# Patient Record
Sex: Male | Born: 1965 | Race: Black or African American | Hispanic: No | Marital: Married | Smoking: Never smoker
Health system: Southern US, Community
[De-identification: ages and names within clinical notes are randomized; demographics above are authoritative.]

## PROBLEM LIST (undated history)

## (undated) DIAGNOSIS — F419 Anxiety disorder, unspecified: Secondary | ICD-10-CM

## (undated) HISTORY — DX: Anxiety disorder, unspecified: F41.9

---

## 2013-12-07 ENCOUNTER — Ambulatory Visit (INDEPENDENT_AMBULATORY_CARE_PROVIDER_SITE_OTHER): Payer: 59 | Admitting: Emergency Medicine

## 2013-12-07 VITALS — BP 138/86 | HR 71 | Temp 98.2°F | Resp 16 | Ht 67.0 in | Wt 264.0 lb

## 2013-12-07 DIAGNOSIS — Z0000 Encounter for general adult medical examination without abnormal findings: Secondary | ICD-10-CM

## 2013-12-07 DIAGNOSIS — E669 Obesity, unspecified: Secondary | ICD-10-CM

## 2013-12-07 DIAGNOSIS — R824 Acetonuria: Secondary | ICD-10-CM

## 2013-12-07 LAB — GLUCOSE, POCT (MANUAL RESULT ENTRY): POC Glucose: 87 mg/dl (ref 70–99)

## 2013-12-07 LAB — POCT UA - MICROSCOPIC ONLY
BACTERIA, U MICROSCOPIC: NEGATIVE
Casts, Ur, LPF, POC: NEGATIVE
Crystals, Ur, HPF, POC: NEGATIVE
MUCUS UA: NEGATIVE
Yeast, UA: NEGATIVE

## 2013-12-07 LAB — POCT URINALYSIS DIPSTICK
BILIRUBIN UA: NEGATIVE
GLUCOSE UA: NEGATIVE
KETONES UA: 15
Leukocytes, UA: NEGATIVE
Nitrite, UA: NEGATIVE
Protein, UA: NEGATIVE
Spec Grav, UA: >=1.03
Urobilinogen, UA: 0.2
pH, UA: 5.5

## 2013-12-07 NOTE — Progress Notes (Addendum)
Subjective:    Patient ID: Benjamin Phelps, male    DOB: [birth year], 48 y.o.   MRN: [id number] This chart was scribed for [name], MD by Jolene Provostobert Halas, Medical Scribe. This patient was seen in Room 3 and the patient's care was started at 2:14 PM.  HPI HPI Comments: Benjamin ColumbiaReynaldo Hollinsworth is a 48 y.o. male who [facility] reporting for a annual physical. Pt states he recently moved from [place]. Pt has a family hx of DM, father, mother and grandmother. Pt's grandfather died of colon cancer in his 480's. Pt states he has had mild recurrent back pain, that he treats with ibuprofen and stretching. Pt states he does not want a flu shot. Pt states he got a tetanus shot last year. Pt states he got blood text this morning at a lab because of his new job. Pt states he will have his [facility]. Pt states he has no major medical problems, and does not take any medication. Pt has NKDA. Pt does not smoke. Pt does not drink.     Review of Systems  Constitutional: Negative for fever, chills and appetite change.  HENT: Negative for rhinorrhea.   Gastrointestinal: Positive for nausea. Negative for vomiting.  Musculoskeletal: Positive for back pain.  Skin: Negative for color change.  Psychiatric/Behavioral: Negative for agitation.       Objective:   Physical Exam  Nursing note and vitals reviewed. Constitutional: He is oriented to person, place, and time. He appears well-developed and well-nourished.  Obese.   HENT:  Head: Normocephalic and atraumatic.  Eyes: Pupils are equal, round, and reactive to light.  Neck: No JVD present.  Cardiovascular: Normal rate, regular rhythm and normal heart sounds.   Pulmonary/Chest: Effort normal and breath sounds normal. No respiratory distress.  Abdominal: Soft. Bowel sounds are normal. There is no tenderness.  Genitourinary: Penis normal.  Prostate normal, no masses detected.  Musculoskeletal: Normal range of motion. He exhibits no edema.    Neurological: He is alert and oriented to person, place, and time.  Skin: Skin is warm and dry.  Psychiatric: He has a normal mood and affect. His behavior is normal.   Results for orders placed in visit on 12/07/13  POCT URINALYSIS DIPSTICK      Result Value Ref Range   Color, UA Yellow     Clarity, UA Clear     Glucose, UA Neg     Bilirubin, UA Neg     Ketones, UA 15     Spec Grav, UA >=1.030     Blood, UA small     pH, UA 5.5     Protein, UA Neg     Urobilinogen, UA 0.2     Nitrite, UA Neg     Leukocytes, UA Negative    POCT UA - MICROSCOPIC ONLY      Result Value Ref Range   WBC, Ur, HPF, POC 0-1     RBC, urine, microscopic 0-1     Bacteria, U Microscopic neg     Mucus, UA neg     Epithelial cells, urine per micros 0-1     Crystals, Ur, HPF, POC neg     Casts, Ur, LPF, POC neg     Yeast, UA neg    GLUCOSE, POCT (MANUAL RESULT ENTRY)      Result Value Ref Range   POC Glucose 87  70 - 99 mg/dl       Assessment & Plan:  Patient did  have ketones in his urine with high specific gravity prospectus or not eating and fasting today. He had insignificant hematuria. Will await the results of his other testing. He had biometric testing this morning at work and he will bring these results with him. He does not want a flu shot today. He states he is up-to-date on his tetanus immunization

## 2013-12-07 NOTE — Patient Instructions (Signed)

## 2013-12-09 LAB — IFOBT (OCCULT BLOOD): IFOBT: NEGATIVE

## 2015-05-20 ENCOUNTER — Ambulatory Visit (INDEPENDENT_AMBULATORY_CARE_PROVIDER_SITE_OTHER): Payer: 59 | Admitting: Family Medicine

## 2015-05-20 ENCOUNTER — Ambulatory Visit (INDEPENDENT_AMBULATORY_CARE_PROVIDER_SITE_OTHER): Payer: 59

## 2015-05-20 VITALS — BP 122/72 | HR 78 | Temp 98.0°F | Resp 17 | Ht 66.5 in | Wt 267.0 lb

## 2015-05-20 DIAGNOSIS — K219 Gastro-esophageal reflux disease without esophagitis: Secondary | ICD-10-CM | POA: Diagnosis not present

## 2015-05-20 DIAGNOSIS — R079 Chest pain, unspecified: Secondary | ICD-10-CM | POA: Diagnosis not present

## 2015-05-20 DIAGNOSIS — R0789 Other chest pain: Secondary | ICD-10-CM | POA: Diagnosis not present

## 2015-05-20 MED ORDER — MELOXICAM 7.5 MG PO TABS: 7.5000 mg | ORAL_TABLET | Freq: Every day | ORAL | Status: AC

## 2015-05-20 MED ORDER — ESOMEPRAZOLE MAGNESIUM 20 MG PO PACK: 20.0000 mg | PACK | Freq: Every day | ORAL | Status: AC

## 2015-05-20 NOTE — Progress Notes (Signed)
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 Began gradually yesterday after he had a big meal and went to lie down. He's had no shortness of breath.    patient has had some intermittent sweats at night for several weeks.  Patient has no history of diabetes, smoking, alcohol abuse, hypertension, or elevated cholesterol.  Patient's had no nausea, vomiting, epigastric pain or blood in stools.  Objective: Patient is alert, cooperative and pleasant. He is interviewed in the presence of his wife.  HEENT: Unremarkable with normal sclera and EOM. Oropharynx is clear Neck: Supple no adenopathy or thyromegaly Chest: Clear to auscultation, tender in the right pectoral muscle Heart: Regular without murmur, physiologically split S2 Abdomen: Soft nontender without HSM. He is obese and I am unable to palpate any masses. Skin: No rash  EKG: Normal sinus rhythm without any acute changes Chest: Some peribronchial thickening, otherwise no cardiomegaly or infiltrates  Assessment: I think patient has some muscle chest wall pain and reflux.  Chest pain, unspecified chest pain type - Plan: DG Chest 2 View, EKG 12-Lead  Gastroesophageal reflux disease without esophagitis - Plan: esomeprazole (NEXIUM) 20 MG packet  Chest wall pain - Plan: meloxicam (MOBIC) 7.5 MG tablet  Elvina SidleKurt Alvenia Treese, Md

## 2015-05-20 NOTE — Patient Instructions (Addendum)
The x-ray and EKG appear to be normal. I am sending the x-ray to the radiologist and he will over read the film in the next 24 hours.  I believe you have some reflux as well as some muscle strain in the right chest. I'm giving her samples of Nexium to take once a day and I've called in some meloxicam to help with the muscle strain.  Neither of these medicines cause drowsiness and you should see improvement in the next 48 hours.

## 2015-06-15 ENCOUNTER — Other Ambulatory Visit: Payer: Self-pay | Admitting: Family Medicine

## 2016-06-18 ENCOUNTER — Encounter: Payer: Self-pay | Admitting: Emergency Medicine

## 2016-06-18 ENCOUNTER — Ambulatory Visit (INDEPENDENT_AMBULATORY_CARE_PROVIDER_SITE_OTHER): Payer: 59 | Admitting: Emergency Medicine

## 2016-06-18 VITALS — BP 117/68 | HR 61 | Temp 97.9°F | Resp 18 | Ht 66.5 in | Wt 222.6 lb

## 2016-06-18 DIAGNOSIS — M545 Low back pain, unspecified: Secondary | ICD-10-CM | POA: Insufficient documentation

## 2016-06-18 DIAGNOSIS — M5136 Other intervertebral disc degeneration, lumbar region: Secondary | ICD-10-CM | POA: Diagnosis not present

## 2016-06-18 DIAGNOSIS — M5432 Sciatica, left side: Secondary | ICD-10-CM | POA: Diagnosis not present

## 2016-06-18 MED ORDER — CYCLOBENZAPRINE HCL 10 MG PO TABS: 10.0000 mg | ORAL_TABLET | Freq: Three times a day (TID) | ORAL | 0 refills | Status: AC | PRN

## 2016-06-18 MED ORDER — DICLOFENAC SODIUM 75 MG PO TBEC: 75.0000 mg | DELAYED_RELEASE_TABLET | Freq: Two times a day (BID) | ORAL | 0 refills | Status: AC

## 2016-06-18 NOTE — Progress Notes (Signed)
Jeryl Columbia 51 y.o.   Chief Complaint  Patient presents with  . Leg Pain    left thigh pain x 3 weeks     HISTORY OF PRESENT ILLNESS: This is a 51 y.o. male complaining of throbbing pain to left upper leg x 3 weeks; feels like sciatica pain; has h/o lumbar disc disease; MRI 4 years ago; denies recent trauma or any other significant symptoms.   HPI   Prior to Admission medications   Medication Sig Start Date End Date Taking? Authorizing Provider  Ascorbic Acid (VITAMIN C) 100 MG tablet Take 100 mg by mouth daily.   Yes Historical Provider, MD  aspirin 81 MG tablet Take 81 mg by mouth daily.   Yes Historical Provider, MD  Multiple Vitamins-Minerals (MULTIVITAMIN WITH MINERALS) tablet Take 1 tablet by mouth daily.   Yes Historical Provider, MD  esomeprazole (NEXIUM) 20 MG packet Take 20 mg by mouth daily before breakfast. Patient not taking: Reported on 06/18/2016 05/20/15   Elvina Sidle, MD  meloxicam (MOBIC) 7.5 MG tablet Take 1 tablet (7.5 mg total) by mouth daily. Patient not taking: Reported on 06/18/2016 05/20/15   Elvina Sidle, MD    No Known Allergies  Patient Active Problem List   Diagnosis Date Noted  . Obesity 12/07/2013    Past Medical History:  Diagnosis Date  . Anxiety     No past surgical history on file.  Social History   Social History  . Marital status: Married    Spouse name: N/A  . Number of children: N/A  . Years of education: N/A   Occupational History  . Not on file.   Social History Main Topics  . Smoking status: Never Smoker  . Smokeless tobacco: Never Used  . Alcohol use No  . Drug use: No  . Sexual activity: No   Other Topics Concern  . Not on file   Social History Narrative  . No narrative on file    Family History  Problem Relation Age of Onset  . Diabetes Mother   . Hypertension Mother   . Cancer Father   . Hypertension Father   . Diabetes Father   . Diabetes Maternal Grandmother   . Diabetes Maternal Grandfather     . Cancer Maternal Grandfather      Review of Systems  Constitutional: Negative.  Negative for chills and fever.  HENT: Negative.   Eyes: Negative.   Respiratory: Negative.  Negative for shortness of breath.   Cardiovascular: Negative.  Negative for chest pain and palpitations.  Gastrointestinal: Negative for abdominal pain, diarrhea, nausea and vomiting.  Musculoskeletal: Positive for back pain.  Skin: Negative.  Negative for rash.  Neurological: Negative.  Negative for dizziness, sensory change, focal weakness and headaches.  All other systems reviewed and are negative.  Vitals:   06/18/16 1339  BP: 117/68  Pulse: 61  Resp: 18  Temp: 97.9 F (36.6 C)     Physical Exam  Constitutional: He is oriented to person, place, and time. He appears well-developed and well-nourished.  HENT:  Head: Normocephalic and atraumatic.  Eyes: EOM are normal. Pupils are equal, round, and reactive to light.  Neck: Normal range of motion. Neck supple.  Cardiovascular: Normal rate.   Pulmonary/Chest: Effort normal and breath sounds normal.  Abdominal: Soft. Bowel sounds are normal. He exhibits no distension. There is no tenderness.  Musculoskeletal:       Lumbar back: He exhibits decreased range of motion, tenderness and spasm. He exhibits normal pulse.  Back:  LE: +varicose veins  Neurological: He is alert and oriented to person, place, and time. No sensory deficit. He exhibits normal muscle tone.  Reflex Scores:      Patellar reflexes are 3+ on the right side and 1+ on the left side.      Achilles reflexes are 2+ on the right side and 2+ on the left side. Skin: Skin is warm and dry. Capillary refill takes less than 2 seconds.  Psychiatric: He has a normal mood and affect. His behavior is normal.  Vitals reviewed.    ASSESSMENT & PLAN: Donnetta SimpersReynaldo was seen today for leg pain.  Diagnoses and all orders for this visit:  Lumbar pain -     Ambulatory referral to Orthopedic  Surgery  Sciatica of left side -     Ambulatory referral to Orthopedic Surgery  DDD (degenerative disc disease), lumbar -     Ambulatory referral to Orthopedic Surgery  Other orders -     diclofenac (VOLTAREN) 75 MG EC tablet; Take 1 tablet (75 mg total) by mouth 2 (two) times daily. -     cyclobenzaprine (FLEXERIL) 10 MG tablet; Take 1 tablet (10 mg total) by mouth 3 (three) times daily as needed for muscle spasms.    Patient Instructions       IF you received an x-ray today, you will receive an invoice from [facility]. Please contact [facility] at [phone] with questions or concerns regarding your invoice.   IF you received labwork today, you will receive an invoice from [facility]. Please contact [facility] at [phone] with questions or concerns regarding your invoice.   Our billing staff will not be able to assist you with questions regarding bills from these companies.  You will be contacted with the lab results as soon as they are available. The fastest way to get your results is to activate your My Chart account. Instructions are located on the last page of this paperwork. If you have not heard from us regarding the results in 2 weeks, please contact this office.      Sciatica Sciatica is pain, numbness, weakness, or tingling along your sciatic nerve. The sciatic nerve starts in the lower back and goes down the back of each leg. Sciatica happens when this nerve is pinched or has pressure put on it. Sciatica usually goes away on its own or with treatment. Sometimes, sciatica may keep coming back (recur). Follow these instructions at home: Medicines   Take over-the-counter and prescription medicines only as told by your doctor.  Do not drive or use heavy machinery while taking prescription pain medicine. Managing pain   If directed, put ice on the affected area.  Put ice in a plastic bag.  Place a towel between your skin and the bag.  Leave  the ice on for 20 minutes, 2-3 times a day.  After icing, apply heat to the affected area before you exercise or as often as told by your doctor. Use the heat source that your doctor tells you to use, such as a moist heat pack or a heating pad.  Place a towel between your skin and the heat source.  Leave the heat on for 20-30 minutes.  Remove the heat if your skin turns bright red. This is especially important if you are unable to feel pain, heat, or cold. You may have a greater risk of getting burned. Activity   Return to your normal activities as told by your doctor. Ask your doctor what  activities are safe for you.  Avoid activities that make your sciatica worse.  Take short rests during the day. Rest in a lying or standing position. This is usually better than sitting to rest.  When you rest for a long time, do some physical activity or stretching between periods of rest.  Avoid sitting for a long time without moving. Get up and move around at least one time each hour.  Exercise and stretch regularly, as told by your doctor.  Do not lift anything that is heavier than 10 lb (4.5 kg) while you have symptoms of sciatica.  Avoid lifting heavy things even when you do not have symptoms.  Avoid lifting heavy things over and over.  When you lift objects, always lift in a way that is safe for your body. To do this, you should:  Bend your knees.  Keep the object close to your body.  Avoid twisting. General instructions   Use good posture.  Avoid leaning forward when you are sitting.  Avoid hunching over when you are standing.  Stay at a healthy weight.  Wear comfortable shoes that support your feet. Avoid wearing high heels.  Avoid sleeping on a mattress that is too soft or too hard. You might have less pain if you sleep on a mattress that is firm enough to support your back.  Keep all follow-up visits as told by your doctor. This is important. Contact a doctor if:  You  have pain that:  Wakes you up when you are sleeping.  Gets worse when you lie down.  Is worse than the pain you have had in the past.  Lasts longer than 4 weeks.  You lose weight for without trying. Get help right away if:  You cannot control when you pee (urinate) or poop (have a bowel movement).  You have weakness in any of these areas and it gets worse.  Lower back.  Lower belly (pelvis).  Butt (buttocks).  Legs.  You have redness or swelling of your back.  You have a burning feeling when you pee. This information is not intended to replace advice given to you by your health care provider. Make sure you discuss any questions you have with your health care provider. Document Released: 11/12/2007 Document Revised: 07/11/2015 Document Reviewed: 10/12/2014 Elsevier Interactive Patient Education  2017 Elsevier Inc.      Edwina Barth, MD [facility]

## 2016-06-18 NOTE — Patient Instructions (Addendum)
   IF you received an x-ray today, you will receive an invoice from [facility]. Please contact  Radiology at [phone] with questions or concerns regarding your invoice.   IF you received labwork today, you will receive an invoice from [facility]. Please contact [facility] at [phone] with questions or concerns regarding your invoice.   Our billing staff will not be able to assist you with questions regarding bills from these companies.  You will be contacted with the lab results as soon as they are available. The fastest way to get your results is to activate your My Chart account. Instructions are located on the last page of this paperwork. If you have not heard from us regarding the results in 2 weeks, please contact this office.     Sciatica Sciatica is pain, numbness, weakness, or tingling along your sciatic nerve. The sciatic nerve starts in the lower back and goes down the back of each leg. Sciatica happens when this nerve is pinched or has pressure put on it. Sciatica usually goes away on its own or with treatment. Sometimes, sciatica may keep coming back (recur). Follow these instructions at home: Medicines   Take over-the-counter and prescription medicines only as told by your doctor.  Do not drive or use heavy machinery while taking prescription pain medicine. Managing pain   If directed, put ice on the affected area.  Put ice in a plastic bag.  Place a towel between your skin and the bag.  Leave the ice on for 20 minutes, 2-3 times a day.  After icing, apply heat to the affected area before you exercise or as often as told by your doctor. Use the heat source that your doctor tells you to use, such as a moist heat pack or a heating pad.  Place a towel between your skin and the heat source.  Leave the heat on for 20-30 minutes.  Remove the heat if your skin turns bright red. This is especially important if you are unable to feel pain, heat, or  cold. You may have a greater risk of getting burned. Activity   Return to your normal activities as told by your doctor. Ask your doctor what activities are safe for you.  Avoid activities that make your sciatica worse.  Take short rests during the day. Rest in a lying or standing position. This is usually better than sitting to rest.  When you rest for a long time, do some physical activity or stretching between periods of rest.  Avoid sitting for a long time without moving. Get up and move around at least one time each hour.  Exercise and stretch regularly, as told by your doctor.  Do not lift anything that is heavier than 10 lb (4.5 kg) while you have symptoms of sciatica.  Avoid lifting heavy things even when you do not have symptoms.  Avoid lifting heavy things over and over.  When you lift objects, always lift in a way that is safe for your body. To do this, you should:  Bend your knees.  Keep the object close to your body.  Avoid twisting. General instructions   Use good posture.  Avoid leaning forward when you are sitting.  Avoid hunching over when you are standing.  Stay at a healthy weight.  Wear comfortable shoes that support your feet. Avoid wearing high heels.  Avoid sleeping on a mattress that is too soft or too hard. You might have less pain if you sleep on a mattress   that is firm enough to support your back.  Keep all follow-up visits as told by your doctor. This is important. Contact a doctor if:  You have pain that:  Wakes you up when you are sleeping.  Gets worse when you lie down.  Is worse than the pain you have had in the past.  Lasts longer than 4 weeks.  You lose weight for without trying. Get help right away if:  You cannot control when you pee (urinate) or poop (have a bowel movement).  You have weakness in any of these areas and it gets worse.  Lower back.  Lower belly (pelvis).  Butt (buttocks).  Legs.  You have redness  or swelling of your back.  You have a burning feeling when you pee. This information is not intended to replace advice given to you by your health care provider. Make sure you discuss any questions you have with your health care provider. Document Released: 11/12/2007 Document Revised: 07/11/2015 Document Reviewed: 10/12/2014 Elsevier Interactive Patient Education  2017 Elsevier Inc.  

## 2017-08-22 IMAGING — CR DG CHEST 2V
2 series · 2 of 2 positions shown · non-contrast
Comparison: None.

CLINICAL DATA: Right side chest pain

EXAM:
CHEST  2 VIEW

[PA]
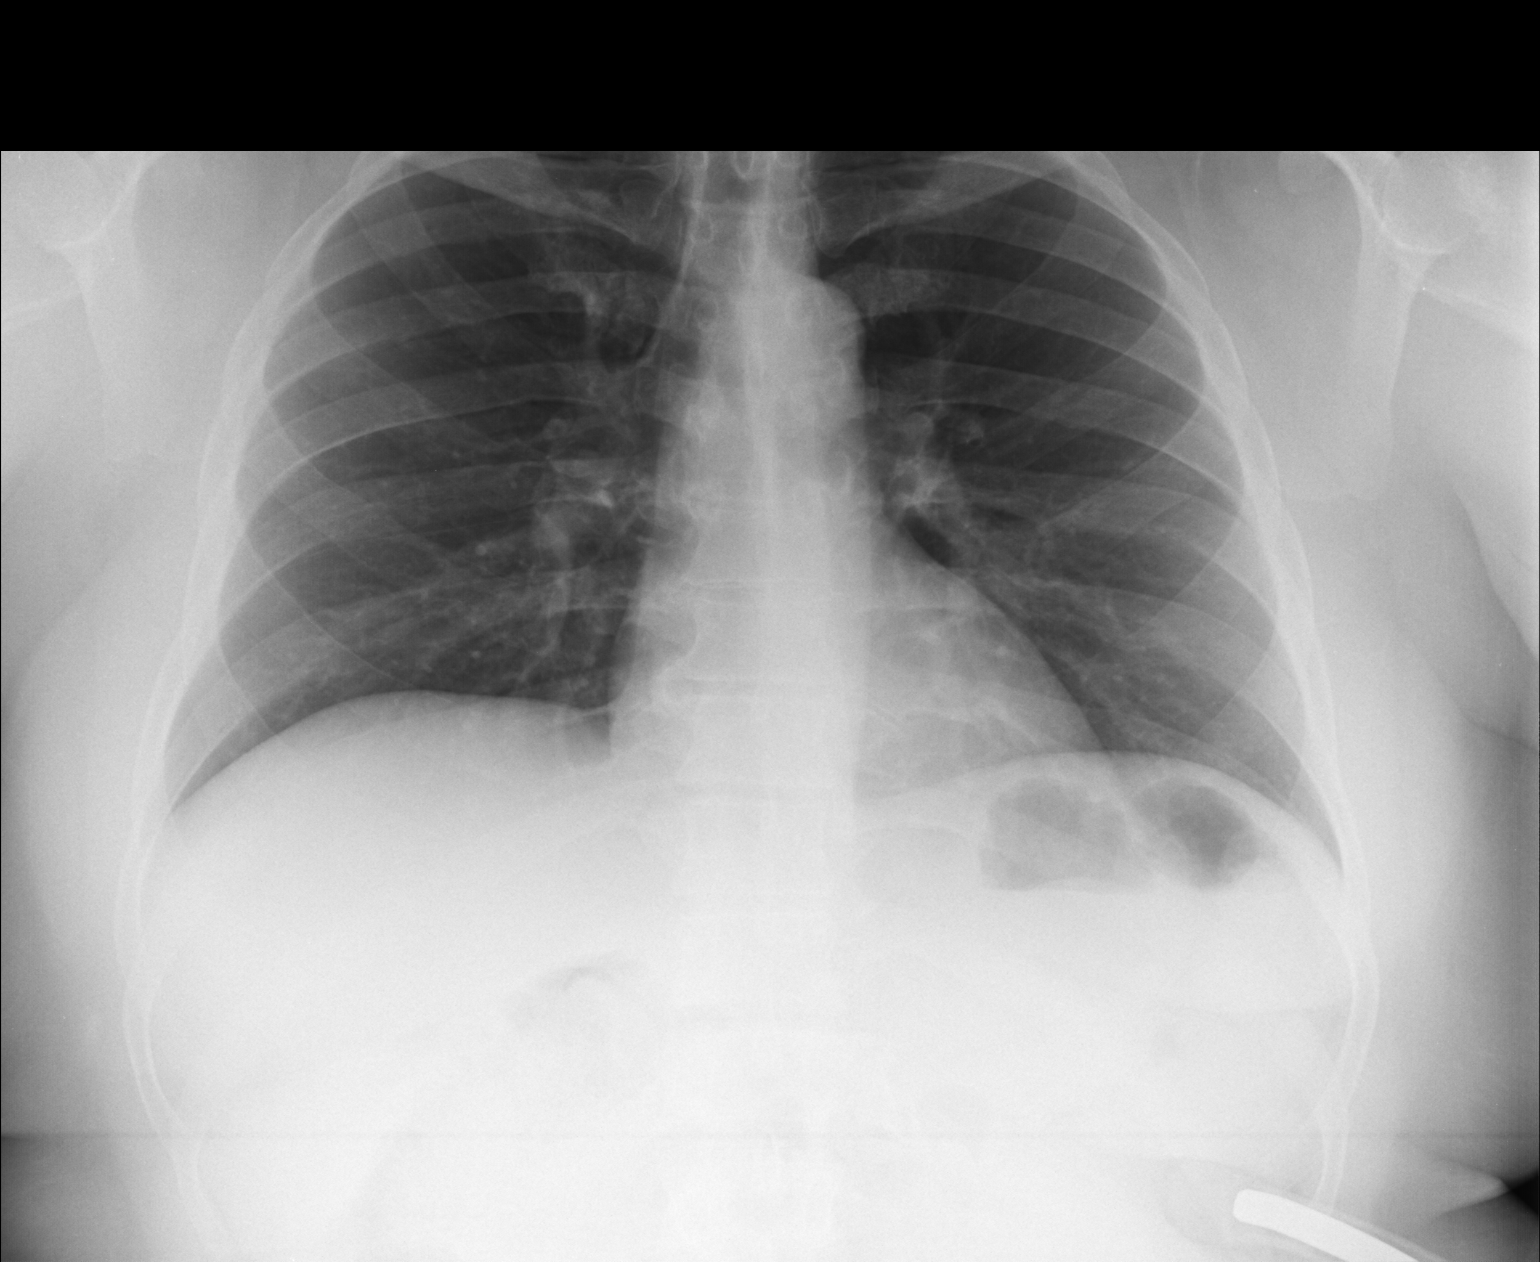

[lateral]
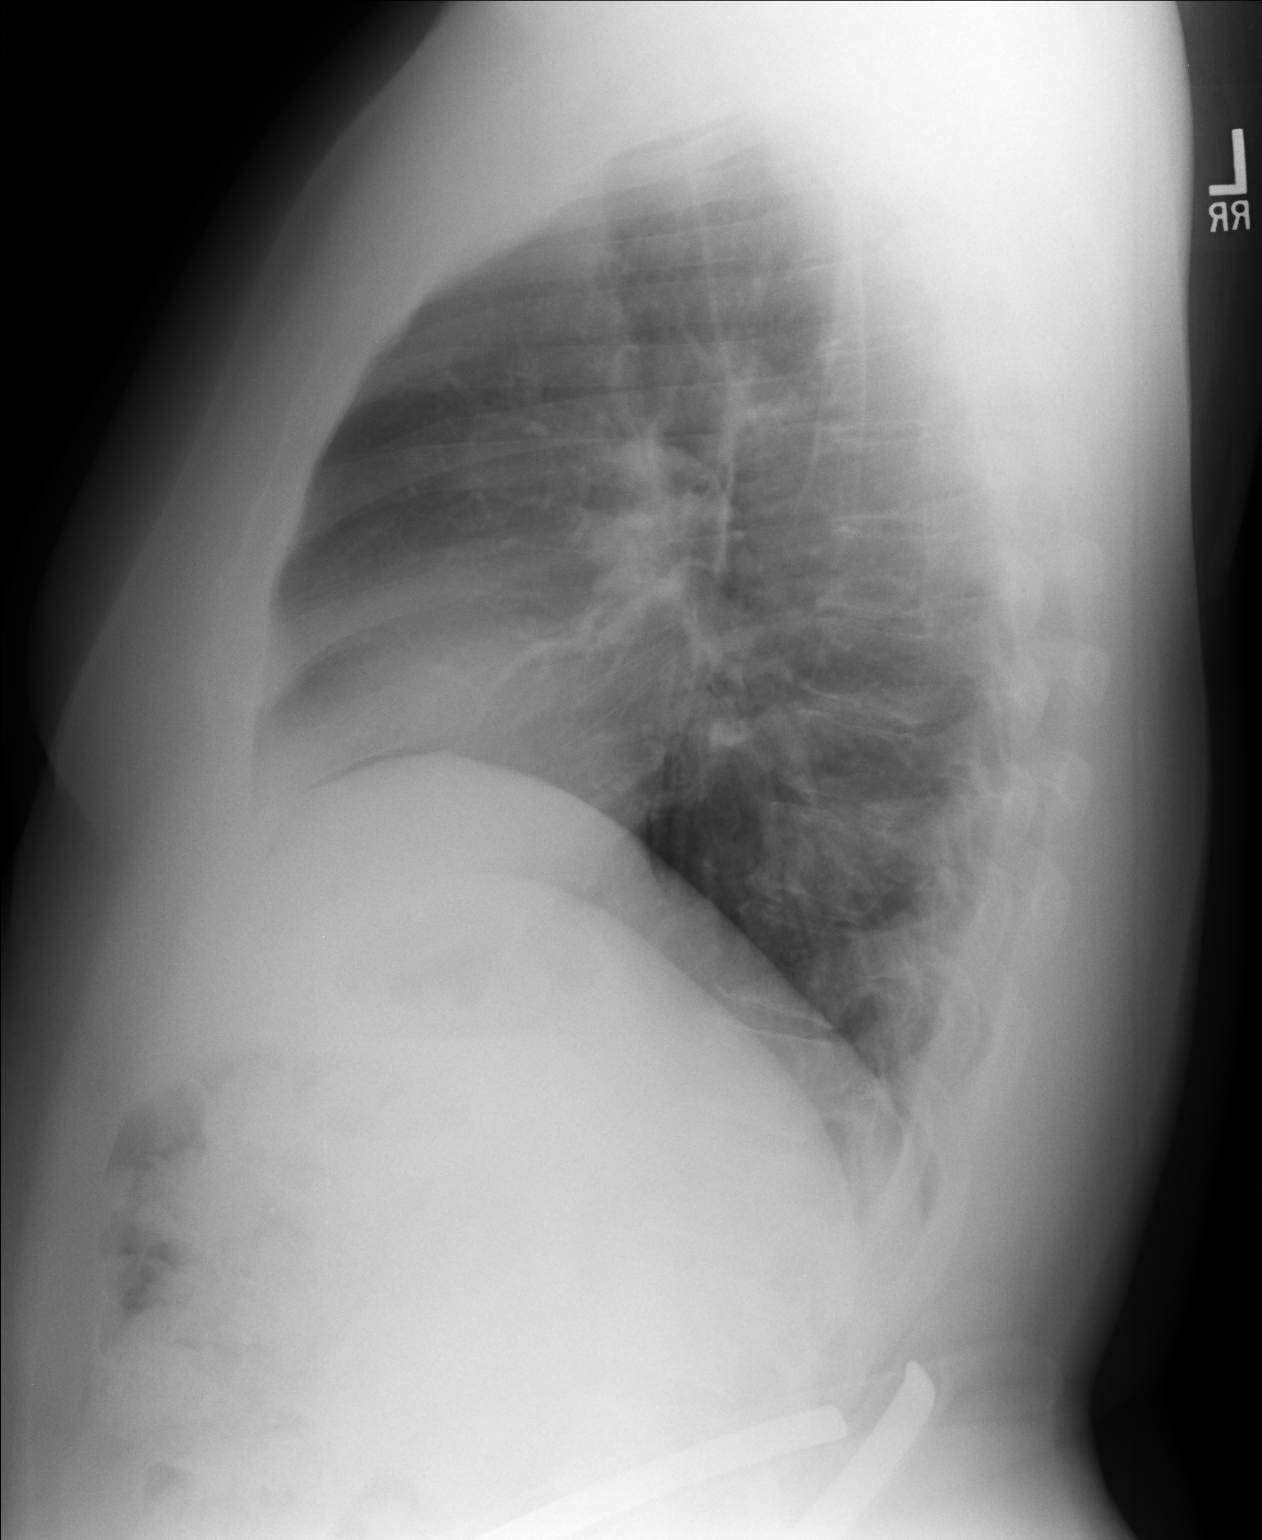

[2 of 2 positions shown; findings below may reference images not displayed]

FINDINGS: Cardiomediastinal silhouette is unremarkable. No acute infiltrate or
pleural effusion. No pulmonary edema. Bony thorax is unremarkable.
IMPRESSION: No active cardiopulmonary disease.

## 2023-06-04 ENCOUNTER — Emergency Department (HOSPITAL_COMMUNITY)
Admission: EM | Admit: 2023-06-04 | Discharge: 2023-06-05 | Disposition: A | Discharge status: Home / Self Care | Attending: Emergency Medicine | Admitting: Emergency Medicine

## 2023-06-04 ENCOUNTER — Other Ambulatory Visit: Payer: Self-pay

## 2023-06-04 ENCOUNTER — Emergency Department (HOSPITAL_COMMUNITY)

## 2023-06-04 ENCOUNTER — Encounter (HOSPITAL_COMMUNITY): Payer: Self-pay

## 2023-06-04 DIAGNOSIS — Z7982 Long term (current) use of aspirin: Secondary | ICD-10-CM | POA: Insufficient documentation

## 2023-06-04 DIAGNOSIS — R0789 Other chest pain: Secondary | ICD-10-CM | POA: Insufficient documentation

## 2023-06-04 LAB — BASIC METABOLIC PANEL WITH GFR
Anion gap: 11 (ref 5–15)
BUN: 10 mg/dL (ref 6–20)
CO2: 21 mmol/L — ABNORMAL LOW (ref 22–32)
Calcium: 9 mg/dL (ref 8.9–10.3)
Chloride: 109 mmol/L (ref 98–111)
Creatinine, Ser: 1.01 mg/dL (ref 0.61–1.24)
GFR, Estimated: >60 mL/min (ref >60)
Glucose, Bld: 103 mg/dL — ABNORMAL HIGH (ref 70–99)
Potassium: 3.9 mmol/L (ref 3.5–5.1)
Sodium: 141 mmol/L (ref 135–145)

## 2023-06-04 LAB — CBC WITH DIFFERENTIAL/PLATELET
Abs Immature Granulocytes: 0.01 10*3/uL (ref 0.00–0.07)
Basophils Absolute: 0 10*3/uL (ref 0.0–0.1)
Basophils Relative: 1 %
Eosinophils Absolute: 0.1 10*3/uL (ref 0.0–0.5)
Eosinophils Relative: 2 %
HCT: 42.8 % (ref 39.0–52.0)
Hemoglobin: 13.8 g/dL (ref 13.0–17.0)
Immature Granulocytes: 0 %
Lymphocytes Relative: 34 %
Lymphs Abs: 2.2 10*3/uL (ref 0.7–4.0)
MCH: 27.4 pg (ref 26.0–34.0)
MCHC: 32.2 g/dL (ref 30.0–36.0)
MCV: 85.1 fL (ref 80.0–100.0)
Monocytes Absolute: 0.4 10*3/uL (ref 0.1–1.0)
Monocytes Relative: 6 %
Neutro Abs: 3.7 10*3/uL (ref 1.7–7.7)
Neutrophils Relative %: 57 %
Platelets: 218 10*3/uL (ref 150–400)
RBC: 5.03 MIL/uL (ref 4.22–5.81)
RDW: 14 % (ref 11.5–15.5)
WBC: 6.5 10*3/uL (ref 4.0–10.5)
nRBC: 0 % (ref 0.0–0.2)

## 2023-06-04 LAB — TROPONIN I (HIGH SENSITIVITY): Troponin I (High Sensitivity): 8 ng/L (ref <18)

## 2023-06-04 NOTE — ED Provider Triage Note (Signed)
 Emergency Medicine Provider Triage Evaluation Note  Benjamin Phelps , a 58 y.o. male  was evaluated in triage.  Pt complains of chest pain.  Review of Systems  Positive: Pressure in the chest, SOB Negative: Vomiting, fever, cough  Physical Exam  There were no vitals taken for this visit. Gen:   Awake, no distress   Resp:  Normal effort  MSK:   Moves extremities without difficulty  Other:    Medical Decision Making  Medically screening exam initiated at 9:33 PM.  Appropriate orders placed.  Dakarri Kessinger was informed that the remainder of the evaluation will be completed by another provider, this initial triage assessment does not replace that evaluation, and the importance of remaining in the ED until their evaluation is complete.  40 minutes ago started having chest pressure while at work. Has had in the past without known diagnosis, "worse tonight".    Mandy Second, PA-C 06/04/23 2134

## 2023-06-04 NOTE — ED Triage Notes (Signed)
 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 EMS gave 324mg  aspirin, no cardiac hx, hx of anxiety. Patient reports improvement since medication.

## 2023-06-05 LAB — TROPONIN I (HIGH SENSITIVITY): Troponin I (High Sensitivity): 7 ng/L (ref <18)

## 2023-06-05 NOTE — ED Provider Notes (Signed)
 [facility] [facility] Provider Note   CSN: [id number] Arrival date & time: 06/04/23  2128     History  Chief Complaint  Patient presents with   Chest Pain    Benjamin Phelps is a 58 y.o. male.  Patient presents to the emergency department for evaluation of chest pain.  No history of cardiac disease.  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  Symptoms lasted about 30 minutes and then resolved.  He did have some worsening of the pain with breaths when they occurred.  No shortness of breath.       Home Medications Prior to Admission medications   Medication Sig Start Date End Date Taking? Authorizing Provider  Ascorbic Acid (VITAMIN C) 100 MG tablet Take 100 mg by mouth daily.    [provider]  aspirin 81 MG tablet Take 81 mg by mouth daily.    [provider]  cyclobenzaprine  (FLEXERIL ) 10 MG tablet Take 1 tablet (10 mg total) by mouth 3 (three) times daily as needed for muscle spasms. 06/18/16   Elvira Hammersmith, MD  esomeprazole  (NEXIUM ) 20 MG packet Take 20 mg by mouth daily before breakfast. Patient not taking: Reported on 06/18/2016 05/20/15   Dain Drown, MD  meloxicam  (MOBIC ) 7.5 MG tablet Take 1 tablet (7.5 mg total) by mouth daily. Patient not taking: Reported on 06/18/2016 05/20/15   Dain Drown, MD  Multiple Vitamins-Minerals (MULTIVITAMIN WITH MINERALS) tablet Take 1 tablet by mouth daily.    [provider]      Allergies    Patient has no known allergies.    Review of Systems   Review of Systems  Physical Exam Updated Vital Signs BP 128/84   Pulse (!) 55   Temp 98.6 F (37 C)   Resp 18   Ht 5\' 7"  (1.702 m)   Wt 102.1 kg   SpO2 99%   BMI 35.24 kg/m  Physical Exam Vitals and nursing note reviewed.  Constitutional:      General: He is not in acute distress.    Appearance: He is well-developed.  HENT:      Head: Normocephalic and atraumatic.     Mouth/Throat:     Mouth: Mucous membranes are moist.  Eyes:     General: Vision grossly intact. Gaze aligned appropriately.     Extraocular Movements: Extraocular movements intact.     Conjunctiva/sclera: Conjunctivae normal.  Cardiovascular:     Rate and Rhythm: Normal rate and regular rhythm.     Pulses: Normal pulses.     Heart sounds: Normal heart sounds, S1 normal and S2 normal. No murmur heard.    No friction rub. No gallop.  Pulmonary:     Effort: Pulmonary effort is normal. No respiratory distress.     Breath sounds: Normal breath sounds.  Abdominal:     Palpations: Abdomen is soft.     Tenderness: There is no abdominal tenderness. There is no guarding or rebound.     Hernia: No hernia is present.  Musculoskeletal:        General: No swelling.     Cervical back: Full passive range of motion without pain, normal range of motion and neck supple. No pain with movement, spinous process tenderness or muscular tenderness. Normal range of motion.     Right lower leg: No edema.     Left lower leg: No edema.  Skin:  General: Skin is warm and dry.     Capillary Refill: Capillary refill takes less than 2 seconds.     Findings: No ecchymosis, erythema, lesion or wound.  Neurological:     Mental Status: He is alert and oriented to person, place, and time.     GCS: GCS eye subscore is 4. GCS verbal subscore is 5. GCS motor subscore is 6.     Cranial Nerves: Cranial nerves 2-12 are intact.     Sensory: Sensation is intact.     Motor: Motor function is intact. No weakness or abnormal muscle tone.     Coordination: Coordination is intact.  Psychiatric:        Mood and Affect: Mood normal.        Speech: Speech normal.        Behavior: Behavior normal.     ED Results / Procedures / Treatments   Labs (all labs ordered are listed, but only abnormal results are displayed) Labs Reviewed  BASIC METABOLIC PANEL WITH GFR - Abnormal; Notable for  the following components:      Result Value   CO2 21 (*)    Glucose, Bld 103 (*)    All other components within normal limits  CBC WITH DIFFERENTIAL/PLATELET  TROPONIN I (HIGH SENSITIVITY)  TROPONIN I (HIGH SENSITIVITY)    EKG EKG Interpretation Date/Time:  Saturday June 05 2023 01:30:47 EDT Ventricular Rate:  57 PR Interval:  131 QRS Duration:  90 QT Interval:  391 QTC Calculation: 381 R Axis:   60  Text Interpretation: Sinus rhythm Consider left atrial enlargement Low voltage, precordial leads No previous tracing Confirmed by Ballard Bongo [id number]) on 06/05/2023 1:32:35 AM  Radiology DG Chest 2 View Result Date: 06/04/2023 CLINICAL DATA:  Chest pain EXAM: CHEST - 2 VIEW COMPARISON:  05/20/2015 FINDINGS: The heart size and mediastinal contours are within normal limits. Both lungs are clear. Small focus of sclerosis at left humeral head, could reflect prominent bone island. IMPRESSION: No active cardiopulmonary disease. Electronically Signed   By: Esmeralda Hedge M.D.   On: 06/04/2023 23:27    Procedures Procedures    Medications Ordered in ED Medications - No data to display  ED Course/ Medical Decision Making/ A&P                                 Medical Decision Making  Differential Diagnosis considered includes, but not limited to: STEMI; NSTEMI; myocarditis; pericarditis; pulmonary embolism; aortic dissection; pneumothorax; pneumonia; gastritis; musculoskeletal pain  Patient presents to the emergency department for evaluation of chest pain.  Patient had approximately 30 minutes of discomfort that he described as a pressure in the center of his chest earlier today.  Symptoms resolved and have not reoccurred.  No known coronary artery disease.  Symptoms seem atypical for acute coronary syndrome.  Patient's EKG without ischemic changes and troponin negative x 2.  Patient is not short of breath.  No tachycardia, tachypnea or hypoxia.  No suspicion for PE.  Symptoms  have completely resolved.  Normal mediastinum on chest x-ray, no signs or symptoms that would suggest aortic syndrome.  Patient reassured, no interventions necessary at this time, PCP follow-up, return if symptoms recur.        Final Clinical Impression(s) / ED Diagnoses Final diagnoses:  Atypical chest pain    Rx / DC Orders ED Discharge Orders     None  Ballard Bongo, MD 06/05/23 [id number]
